# Patient Record
Sex: Male | Born: 1990 | Race: White | Hispanic: No | Marital: Single | Smoking: Current every day smoker
Health system: Southern US, Community
[De-identification: ages and names within clinical notes are randomized; demographics above are authoritative.]

## PROBLEM LIST (undated history)

## (undated) DIAGNOSIS — S6291XA Unspecified fracture of right wrist and hand, initial encounter for closed fracture: Secondary | ICD-10-CM

## (undated) DIAGNOSIS — S62509A Fracture of unspecified phalanx of unspecified thumb, initial encounter for closed fracture: Secondary | ICD-10-CM

## (undated) HISTORY — PX: WISDOM TOOTH EXTRACTION: SHX21

---
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
## 2011-07-20 ENCOUNTER — Emergency Department (HOSPITAL_COMMUNITY)
Admission: EM | Admit: 2011-07-20 | Discharge: 2011-07-20 | Disposition: A | Discharge status: Home / Self Care | Payer: Self-pay | Attending: Emergency Medicine | Admitting: Emergency Medicine

## 2011-07-20 ENCOUNTER — Encounter (HOSPITAL_COMMUNITY): Payer: Self-pay | Admitting: Emergency Medicine

## 2011-07-20 ENCOUNTER — Emergency Department (HOSPITAL_COMMUNITY): Payer: Self-pay

## 2011-07-20 DIAGNOSIS — S62523A Displaced fracture of distal phalanx of unspecified thumb, initial encounter for closed fracture: Secondary | ICD-10-CM

## 2011-07-20 DIAGNOSIS — S62639A Displaced fracture of distal phalanx of unspecified finger, initial encounter for closed fracture: Secondary | ICD-10-CM | POA: Insufficient documentation

## 2011-07-20 DIAGNOSIS — W230XXA Caught, crushed, jammed, or pinched between moving objects, initial encounter: Secondary | ICD-10-CM | POA: Insufficient documentation

## 2011-07-20 MED ORDER — HYDROCODONE-ACETAMINOPHEN 5-325 MG PO TABS: ORAL_TABLET | ORAL | Status: AC

## 2011-07-20 MED ORDER — IBUPROFEN 800 MG PO TABS
800.0000 mg | ORAL_TABLET | Freq: Once | ORAL | Status: AC
  Administered 2011-07-20: 800 mg via ORAL
  Filled 2011-07-20: qty 1

## 2011-07-20 MED ORDER — HYDROCODONE-ACETAMINOPHEN 5-325 MG PO TABS
1.0000 | ORAL_TABLET | Freq: Once | ORAL | Status: AC
  Administered 2011-07-20: 1 via ORAL
  Filled 2011-07-20: qty 1

## 2011-07-20 MED ORDER — IBUPROFEN 800 MG PO TABS: 800.0000 mg | ORAL_TABLET | Freq: Three times a day (TID) | ORAL | Status: AC | PRN

## 2011-07-20 NOTE — ED Notes (Signed)
Ladder fell on R thumb, pt splinted at home ;pt has + sensation, reports unable to move; pt reports that thumb bent backwards and he had to bend it back;

## 2011-07-20 NOTE — Discharge Instructions (Signed)
Please read and follow all provided instructions.  Your diagnoses today include:  1. Fracture of distal phalanx of thumb     Tests performed today include:  An x-ray of your thumb - shows broken bone  Vital signs. See below for your results today.   Medications prescribed:   Vicodin (hydrocodone/acetaminophen) - narcotic pain medication  If you have been prescribed narcotic pain medication such as Vicodin, Percocet or Tramadol: DO NOT drive or perform any activities that require you to be awake and alert because this medicine can make you drowsy. BE VERY CAREFUL not to take multiple medicines containing Tylenol (also called acetaminophen). Doing so can lead to an overdose which can damage your liver and cause liver failure and possibly death.    Ibuprofen - anti-inflammatory pain medication  Do not exceed 800mg  ibuprofen every 8 hours, take with food  You have been prescribed an anti-inflammatory medication or NSAID. Take with food. Take smallest effective dose for the shortest duration needed for your pain. Stop taking if you experience stomach pain or vomiting.   Take any prescribed medications only as directed.  Home care instructions:   Follow any educational materials contained in this packet  Wear your splint for at least one week or until seen by a physician for a follow-up examination.  Follow R.I.C.E. Protocol:  R - rest your injury   I  - use ice on injury without applying directly to skin  C - compress injury with bandage or splint  E - elevate the injury above the level of your heart as much as possible to reduce pain and swelling  Follow-up instructions: Please follow-up with your primary care provider or the provided orthopedic (bone specialist) if you continue to have significant pain or trouble using your thumb in 1 week. In this case you may have a severe injury that requires further care.   If you do not have a primary care doctor -- see below for  referral information.   Return instructions:   Please return if your fingers are numb or tingling, appear very red, white, gray or blue, or you have severe pain (also elevate wrist and loosen splint or wrap)  Please return if you have difficulty moving your fingers.  Please return to the Emergency Department if you experience worsening symptoms.   Please return if you have any other emergent concerns.  Additional Information:  Your vital signs today were: BP 132/87  Pulse 89  Temp(Src) 97.7 F (36.5 C) (Oral)  Resp 18  SpO2 97% If your blood pressure (BP) was elevated above 135/85 this visit, please have this repeated by your doctor within one month. -------------- Wrist injuries are frequent in adults and children. A sprain is an injury to the ligaments that hold your bones together. A strain is an injury to muscle or muscle tendons (cord like structure) from stretching or pulling.   Remember the importance of follow-up and possible follow-up x-rays. Improvement in pain level is not 100% insurance of not having a fracture. -------------- No Primary Care Doctor Call [facility]  [phone] Other agencies that provide inexpensive medical care    [facility]  [phone]    [facility]  [phone]    [facility]  [phone]    [facility]  [phone]    [facility]  [phone]    [facility]  [phone] -------------- RESOURCE GUIDE:  Dental Problems  Patients with Medicaid: [facility]  [facility] [address]                                            [phone] [address] [employer] [address] Phone:  [phone]                                                   Phone:  [phone]  If unable to pay or uninsured, contact:  [facility] or [facility] to become qualified for the adult dental clinic.  Chronic Pain Problems Contact [facility]  [phone] Patients  need to be referred by their primary care doctor.  Insufficient Money for Medicine Contact [employer]:  call "211" or [facility] [phone].  Psychological Services [facility]  [phone] [facility]  [phone] [facility]   [phone] (emergency services [phone])  Substance Abuse Resources [facility]  [phone] [facility] [phone] [facility] [phone] Floyd[facility] Flock [phone] Residential & Outpatient Substance Abuse Program  [phone]  Abuse/Neglect [facility] Child Abuse Hotline 805-735-3824 [facility] Child Abuse Hotline [phone] (After Hours)  Emergency Shelter [facility] [phone]  M[facility] [phone] [facility] [phone]  [facility] Resources  [facility]     [employer]                          [facility] Dept. [address] [place]                       [address]      [address]  Blondell Reveal Phone:  [phone]                                   Phone:  [phone]                 Phone:  [phone]  [facility] Phone:  [phone]  [facility] Child Abuse Hotline 878-662-1903 [phone] (After Hours)

## 2011-07-20 NOTE — Progress Notes (Signed)
Orthopedic Tech Progress Note Patient Details:  Larry Downs December 02, 1990 [id number]  Type of Splint: Thumb spica Splint Location: righty thumb Splint Interventions: Application    Nikki Dom 07/20/2011, 10:56 PM

## 2011-07-20 NOTE — ED Provider Notes (Signed)
History     CSN: [id number]  Arrival date & time 07/20/11  4098   First MD Initiated Contact with Patient 07/20/11 2003      Chief Complaint  Patient presents with  . Finger Injury    (Consider location/radiation/quality/duration/timing/severity/associated sxs/prior treatment) HPI Comments: Patient was working on a ladder and pinched thumb in ladder while retracting it. Occurred 3 hrs ago. Homemade splint applied without relief. Pain at IP joint with limited ROM. No wrist or elbow pain. No N/V, head injury. Movement or palp makes pain worse. Nothing makes it better. Pain does not radiate.   Patient is a 21 y.o. male presenting with hand pain. The history is provided by the patient.  Hand Pain This is a new problem. The current episode started today. The problem has been unchanged. Associated symptoms include arthralgias and joint swelling. Pertinent negatives include no fever, neck pain, numbness or weakness. Exacerbated by: palpation. He has tried immobilization for the symptoms. The treatment provided no relief.    History reviewed. No pertinent past medical history.  Past Surgical History  Procedure Date  . Wisdom tooth extraction     History reviewed. No pertinent family history.  History  Substance Use Topics  . Smoking status: Current Everyday Smoker -- 1.0 packs/day  . Smokeless tobacco: Not on file  . Alcohol Use: Yes     occasion      Review of Systems  Constitutional: Negative for fever.  HENT: Negative for neck pain.   Musculoskeletal: Positive for joint swelling and arthralgias. Negative for back pain and gait problem.  Skin: Negative for wound.  Neurological: Negative for weakness and numbness.    Allergies  Review of patient's allergies indicates no known allergies.  Home Medications  No current outpatient prescriptions on file.  BP 150/89  Pulse 94  Temp(Src) 98.6 F (37 C) (Oral)  Resp 16  SpO2 97%  Physical Exam  Nursing note and vitals  reviewed. Constitutional: He appears well-developed and well-nourished.  HENT:  Head: Normocephalic and atraumatic.  Eyes: Conjunctivae are normal.  Neck: Normal range of motion. Neck supple.  Cardiovascular: Normal pulses.   Pulses:      Radial pulses are 2+ on the right side, and 2+ on the left side.  Musculoskeletal: He exhibits tenderness. He exhibits no edema.       Right wrist: Normal. He exhibits normal range of motion.       Right hand: He exhibits normal capillary refill. normal sensation noted. Decreased strength noted. He exhibits thumb/finger opposition. He exhibits no finger abduction and no wrist extension trouble.       Hands: Neurological: He is alert. No sensory deficit.       Motor, sensation, and vascular distal to the injury is fully intact.   Skin: Skin is warm and dry.  Psychiatric: He has a normal mood and affect.    ED Course  Procedures (including critical care time)  Labs Reviewed - No data to display No results found.   1. Fracture of distal phalanx of thumb     8:42 PM Patient seen and examined. X-ray pending. Medications ordered.   Vital signs reviewed and are as follows: Filed Vitals:   07/20/11 1832  BP: 150/89  Pulse: 94  Temp: 98.6 F (37 C)  Resp: 16   X-ray reviewed by myself, discussed with Dr. Jeraldine Loots.   Patient informed. Ortho f/u given. Finger splint by ortho tech. RICE protocol counseling given.   Patient counseled on use of  narcotic pain medications. Counseled not to combine these medications with others containing tylenol. Urged not to drink alcohol, drive, or perform any other activities that requires focus while taking these medications. The patient verbalizes understanding and agrees with the plan.  MDM  Thumb injury, + fracture, not intraarticular. Immobilization performed. Hand f/u given. RICE protocol indicated. No vascular or neurological compromise.         Renne Crigler, [place] 07/21/11 1714

## 2011-07-21 NOTE — ED Provider Notes (Signed)
Medical screening examination/treatment/procedure(s) were performed by non-physician practitioner and as supervising physician I was immediately available for consultation/collaboration.  Bertha Earwood, MD 07/21/11 1756 

## 2012-06-20 ENCOUNTER — Emergency Department (HOSPITAL_COMMUNITY)
Admission: EM | Admit: 2012-06-20 | Discharge: 2012-06-20 | Disposition: A | Discharge status: Home / Self Care | Payer: Self-pay | Attending: Emergency Medicine | Admitting: Emergency Medicine

## 2012-06-20 ENCOUNTER — Emergency Department (HOSPITAL_COMMUNITY): Payer: Self-pay

## 2012-06-20 ENCOUNTER — Encounter (HOSPITAL_COMMUNITY): Payer: Self-pay | Admitting: Emergency Medicine

## 2012-06-20 DIAGNOSIS — S93402A Sprain of unspecified ligament of left ankle, initial encounter: Secondary | ICD-10-CM

## 2012-06-20 DIAGNOSIS — F17200 Nicotine dependence, unspecified, uncomplicated: Secondary | ICD-10-CM | POA: Insufficient documentation

## 2012-06-20 DIAGNOSIS — Y939 Activity, unspecified: Secondary | ICD-10-CM | POA: Insufficient documentation

## 2012-06-20 DIAGNOSIS — S60229A Contusion of unspecified hand, initial encounter: Secondary | ICD-10-CM | POA: Insufficient documentation

## 2012-06-20 DIAGNOSIS — R609 Edema, unspecified: Secondary | ICD-10-CM | POA: Insufficient documentation

## 2012-06-20 DIAGNOSIS — Y9289 Other specified places as the place of occurrence of the external cause: Secondary | ICD-10-CM | POA: Insufficient documentation

## 2012-06-20 DIAGNOSIS — Z8781 Personal history of (healed) traumatic fracture: Secondary | ICD-10-CM | POA: Insufficient documentation

## 2012-06-20 DIAGNOSIS — W108XXA Fall (on) (from) other stairs and steps, initial encounter: Secondary | ICD-10-CM | POA: Insufficient documentation

## 2012-06-20 DIAGNOSIS — S60222A Contusion of left hand, initial encounter: Secondary | ICD-10-CM

## 2012-06-20 DIAGNOSIS — S93409A Sprain of unspecified ligament of unspecified ankle, initial encounter: Secondary | ICD-10-CM | POA: Insufficient documentation

## 2012-06-20 HISTORY — DX: Unspecified fracture of right wrist and hand, initial encounter for closed fracture: S62.91XA

## 2012-06-20 HISTORY — DX: Fracture of unspecified phalanx of unspecified thumb, initial encounter for closed fracture: S62.509A

## 2012-06-20 MED ORDER — IBUPROFEN 600 MG PO TABS: 600.0000 mg | ORAL_TABLET | Freq: Four times a day (QID) | ORAL | Status: DC | PRN

## 2012-06-20 MED ORDER — HYDROCODONE-ACETAMINOPHEN 5-325 MG PO TABS
1.0000 | ORAL_TABLET | Freq: Once | ORAL | Status: AC
  Administered 2012-06-20: 1 via ORAL
  Filled 2012-06-20: qty 1

## 2012-06-20 MED ORDER — HYDROCODONE-ACETAMINOPHEN 5-325 MG PO TABS: 1.0000 | ORAL_TABLET | ORAL | Status: DC | PRN

## 2012-06-20 NOTE — ED Notes (Signed)
PT ambulated with baseline gait; VSS; A&Ox3; no signs of distress; respirations even and unlabored; skin warm and dry; no questions upon discharge.  

## 2012-06-20 NOTE — Progress Notes (Signed)
Orthopedic Tech Progress Note Patient Details:  Larry Downs 1991/02/24 [id number]  Ortho Devices Type of Ortho Device: Crutches Ortho Device/Splint Interventions: Application   Asia R Thompson 06/20/2012, 10:20 AM

## 2012-06-20 NOTE — ED Notes (Signed)
Ortho tech paged and coming shortly.

## 2012-06-20 NOTE — ED Provider Notes (Signed)
History     CSN: [id number]  Arrival date & time 06/20/12  9562   First MD Initiated Contact with Patient 06/20/12 [phone]      Chief Complaint  Patient presents with  . Ankle Pain    (Consider location/radiation/quality/duration/timing/severity/associated sxs/prior treatment) HPI Pt states he fell down multiple stairs last night at 2 am. Denies head or neck injury. No LOC. C/o left ankle and left hand pain. No numbness or weakness. +swelling and bruising. Unable to ambulate on L ankle.  Past Medical History  Diagnosis Date  . Broken thumb   . Fracture of right hand     Past Surgical History  Procedure Laterality Date  . Wisdom tooth extraction      No family history on file.  History  Substance Use Topics  . Smoking status: Current Every Day Smoker -- 1.00 packs/day  . Smokeless tobacco: Not on file  . Alcohol Use: Yes     Comment: occasion      Review of Systems  Constitutional: Negative for fever and chills.  HENT: Negative for facial swelling and neck pain.   Respiratory: Negative for shortness of breath.   Cardiovascular: Negative for chest pain.  Gastrointestinal: Negative for nausea, vomiting and abdominal pain.  Musculoskeletal: Positive for joint swelling. Negative for back pain.  Skin: Negative for wound.  Neurological: Negative for dizziness, weakness, light-headedness, numbness and headaches.  All other systems reviewed and are negative.    Allergies  Review of patient's allergies indicates no known allergies.  Home Medications  No current outpatient prescriptions on file.  BP 150/113  Pulse 119  Temp(Src) 98.3 F (36.8 C) (Oral)  Resp 20  SpO2 99%  Physical Exam  Nursing note and vitals reviewed. Constitutional: He is oriented to person, place, and time. He appears well-developed and well-nourished. No distress.  HENT:  Head: Normocephalic and atraumatic.  Mouth/Throat: Oropharynx is clear and moist.  Eyes: EOM are normal. Pupils are  equal, round, and reactive to light.  Neck: Normal range of motion. Neck supple.  No posterior midline cervical tenderness  Cardiovascular: Normal rate and regular rhythm.   Pulmonary/Chest: Effort normal and breath sounds normal. No respiratory distress. He has no wheezes. He has no rales.  Abdominal: Soft. Bowel sounds are normal. He exhibits no distension and no mass. There is no tenderness. There is no rebound and no guarding.  Musculoskeletal: Normal range of motion. He exhibits edema and tenderness.  Decreased ROM L ankle due to pain. TTP L lat and med mal. 2+DP/PT. TTP L thenar eminence. FROM. +contusion at site. Good distal cap refill.   Neurological: He is alert and oriented to person, place, and time.  5/5 motor in all ext, sensation intact  Skin: Skin is warm and dry. No rash noted. No erythema.  Psychiatric: He has a normal mood and affect. His behavior is normal.    ED Course  Procedures (including critical care time)  Labs Reviewed - No data to display Dg Ankle Complete Left  06/20/2012  *RADIOLOGY REPORT*  Clinical Data: Ankle pain post fall  LEFT ANKLE COMPLETE - 3+ VIEW  Comparison: 09/13/2006  Findings: Three views of the left ankle submitted.  No acute fracture or subluxation.  Ankle mortise is preserved.  There is soft tissue swelling adjacent to lateral malleolus.  IMPRESSION: No acute fracture or subluxation.  Soft tissue swelling adjacent to lateral malleolus.   Original Report Authenticated By: Natasha Mead, M.D.    Dg Hand Complete Left  06/20/2012  *  RADIOLOGY REPORT*  Clinical Data: Fall, bruising  LEFT HAND - COMPLETE 3+ VIEW  Comparison: None.  Findings: Three views of the left hand submitted.  No acute fracture or subluxation.  No radiopaque foreign body.  IMPRESSION: No acute fracture or subluxation.   Original Report Authenticated By: Natasha Mead, M.D.      1. Hand contusion, left, initial encounter   2. Ankle sprain, left, initial encounter       MDM           Loren Racer, MD 06/20/12 [phone]

## 2012-06-20 NOTE — ED Notes (Signed)
Patient transported to X-ray 

## 2012-06-20 NOTE — ED Notes (Signed)
Pt feel down carpeted apartment stairs; pain in R shoulder; L hand; L ankle swelling. Pt not able to put pressure on foot and limited range of motion. Denies LOC.

## 2013-10-13 IMAGING — CR DG ANKLE COMPLETE 3+V*L*
3 series · 3 of 3 positions shown · non-contrast
Comparison: 09/13/2006

CLINICAL DATA: Ankle pain post fall

LEFT ANKLE COMPLETE - 3+ VIEW

[x ankle ap left]
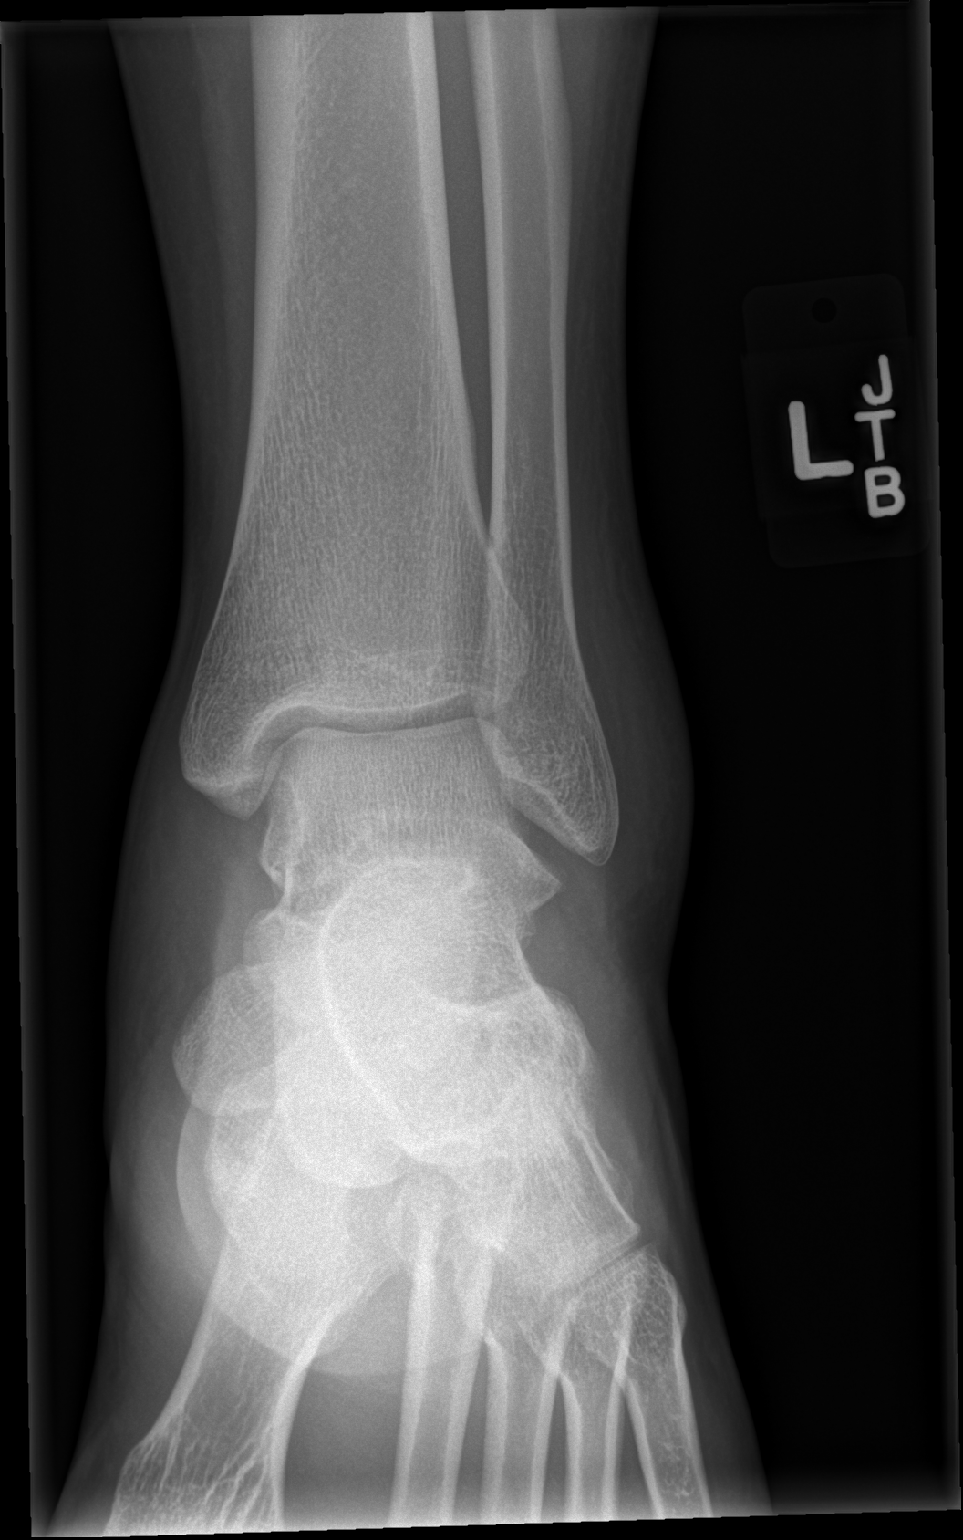

[x ankle obl left]
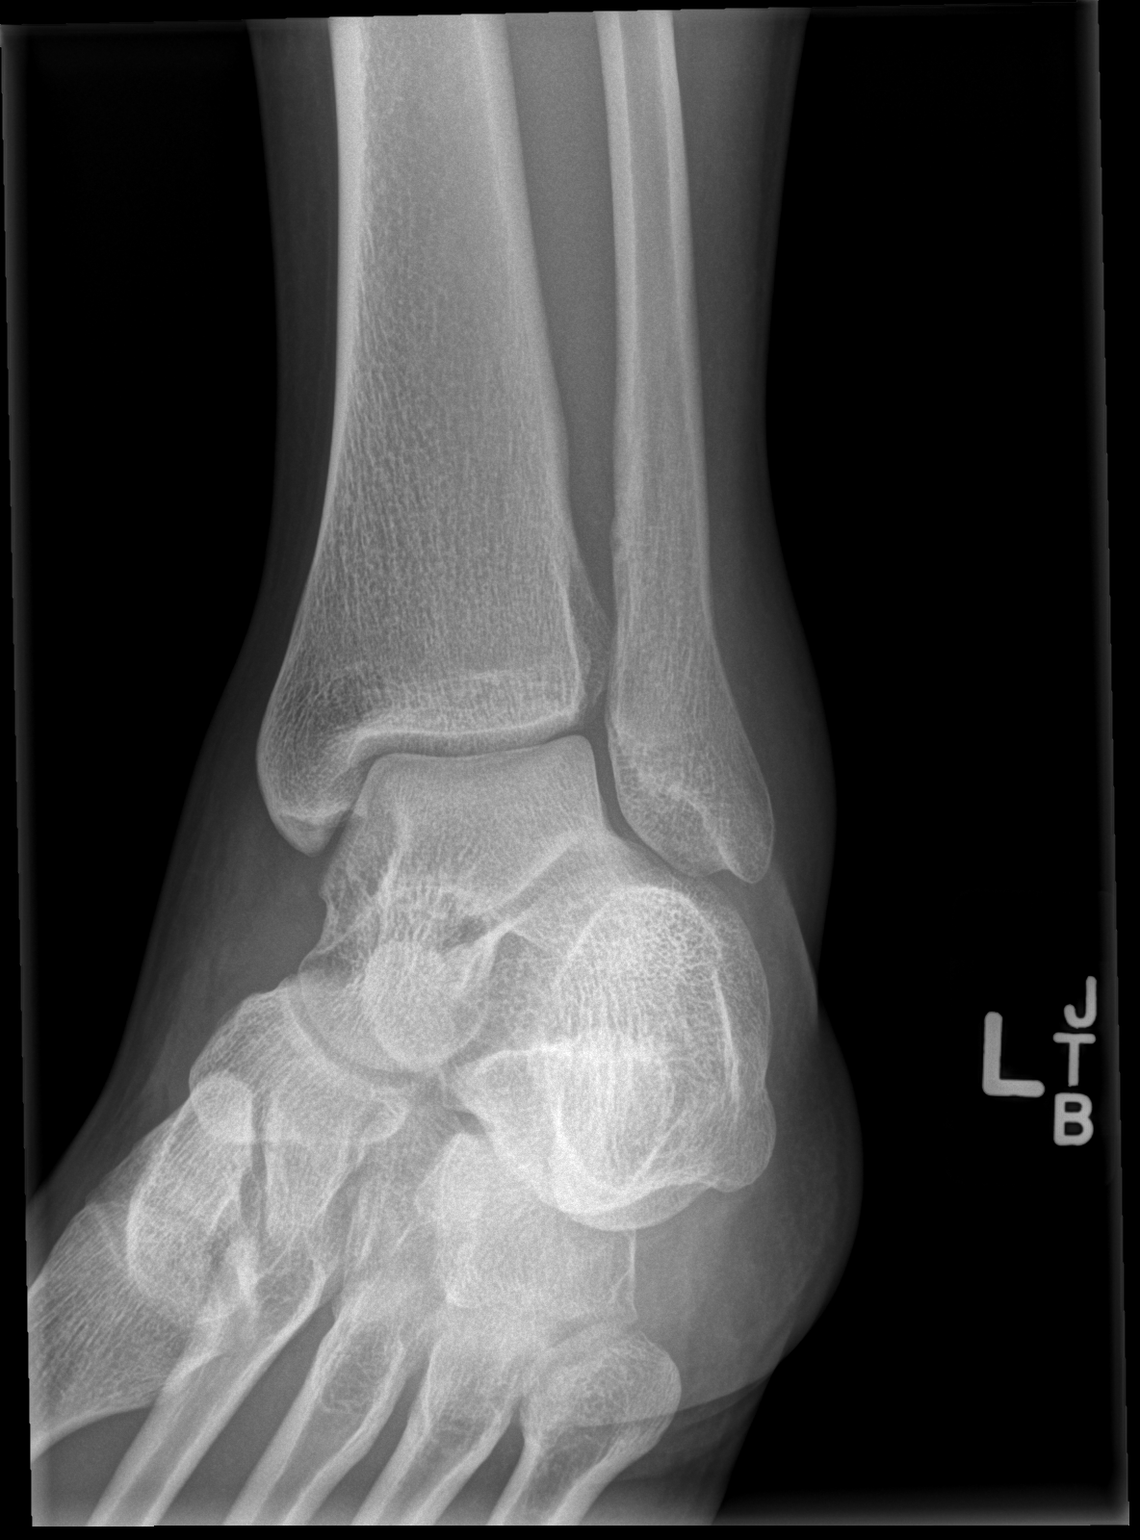

[x ankle lat left]
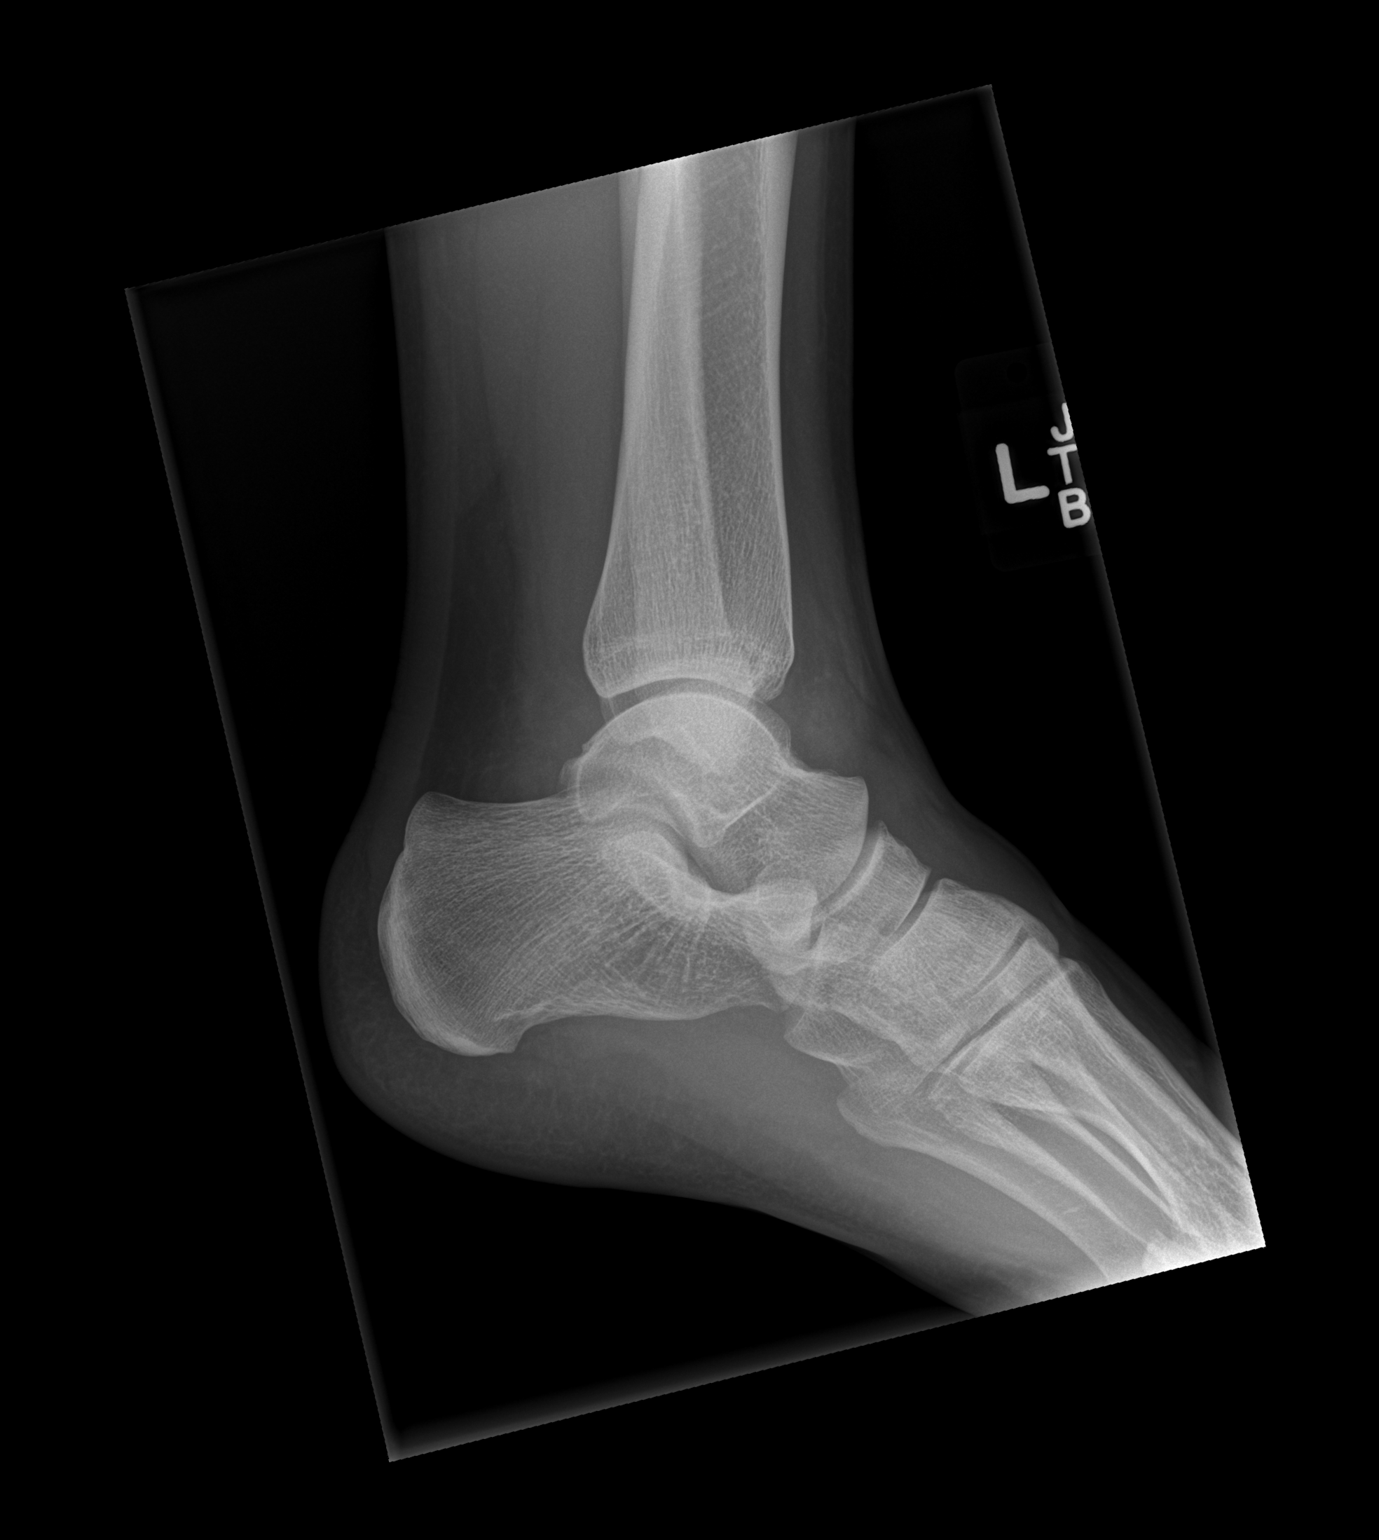

[3 of 3 positions shown; findings below may reference images not displayed]

FINDINGS: Three views of the left ankle submitted.  No acute
fracture or subluxation.  Ankle mortise is preserved.  There is
soft tissue swelling adjacent to lateral malleolus.
IMPRESSION: No acute fracture or subluxation.  Soft tissue swelling adjacent to
lateral malleolus.

## 2017-05-24 ENCOUNTER — Encounter (HOSPITAL_COMMUNITY): Payer: Self-pay | Admitting: Emergency Medicine

## 2017-05-24 ENCOUNTER — Emergency Department (HOSPITAL_COMMUNITY): Payer: Self-pay

## 2017-05-24 ENCOUNTER — Other Ambulatory Visit: Payer: Self-pay

## 2017-05-24 ENCOUNTER — Emergency Department (HOSPITAL_COMMUNITY)
Admission: EM | Admit: 2017-05-24 | Discharge: 2017-05-25 | Disposition: A | Discharge status: Home / Self Care | Payer: Self-pay | Attending: Emergency Medicine | Admitting: Emergency Medicine

## 2017-05-24 ENCOUNTER — Ambulatory Visit (HOSPITAL_COMMUNITY)
Admission: EM | Admit: 2017-05-24 | Discharge: 2017-05-24 | Disposition: A | Discharge status: Nursing Home (Includes ICF) | Payer: Self-pay | Attending: Emergency Medicine | Admitting: Emergency Medicine

## 2017-05-24 DIAGNOSIS — R079 Chest pain, unspecified: Secondary | ICD-10-CM

## 2017-05-24 DIAGNOSIS — E876 Hypokalemia: Secondary | ICD-10-CM | POA: Insufficient documentation

## 2017-05-24 DIAGNOSIS — I10 Essential (primary) hypertension: Secondary | ICD-10-CM | POA: Insufficient documentation

## 2017-05-24 DIAGNOSIS — F17290 Nicotine dependence, other tobacco product, uncomplicated: Secondary | ICD-10-CM | POA: Insufficient documentation

## 2017-05-24 LAB — CBC
HCT: 45.1 % (ref 39.0–52.0)
HEMOGLOBIN: 16 g/dL (ref 13.0–17.0)
MCH: 30.1 pg (ref 26.0–34.0)
MCHC: 35.5 g/dL (ref 30.0–36.0)
MCV: 84.9 fL (ref 78.0–100.0)
Platelets: 181 10*3/uL (ref 150–400)
RBC: 5.31 MIL/uL (ref 4.22–5.81)
RDW: 12.1 % (ref 11.5–15.5)
WBC: 8.3 10*3/uL (ref 4.0–10.5)

## 2017-05-24 LAB — BASIC METABOLIC PANEL WITH GFR
Anion gap: 11 (ref 5–15)
BUN: 13 mg/dL (ref 6–20)
CO2: 25 mmol/L (ref 22–32)
Calcium: 9.6 mg/dL (ref 8.9–10.3)
Chloride: 104 mmol/L (ref 101–111)
Creatinine, Ser: 1.13 mg/dL (ref 0.61–1.24)
GFR calc Af Amer: >60 mL/min
GFR calc non Af Amer: >60 mL/min
Glucose, Bld: 108 mg/dL — ABNORMAL HIGH (ref 65–99)
Potassium: 3.3 mmol/L — ABNORMAL LOW (ref 3.5–5.1)
Sodium: 140 mmol/L (ref 135–145)

## 2017-05-24 LAB — I-STAT TROPONIN, ED: Troponin i, poc: 0 ng/mL (ref 0.00–0.08)

## 2017-05-24 MED ORDER — ASPIRIN 81 MG PO CHEW
324.0000 mg | CHEWABLE_TABLET | Freq: Once | ORAL | Status: AC
  Administered 2017-05-24: 324 mg via ORAL

## 2017-05-24 MED ORDER — ASPIRIN 81 MG PO CHEW
CHEWABLE_TABLET | ORAL | Status: AC
  Filled 2017-05-24: qty 4

## 2017-05-24 NOTE — Discharge Instructions (Addendum)
Let them know if your chest pain changes, gets worse, if you start to feel nauseous or sweaty with this pain.

## 2017-05-24 NOTE — ED Provider Notes (Signed)
HPI  SUBJECTIVE:  Larry Downs is a 27 y.o. male who presents with intermittent, daily, left-sided chest pain.  He states that 2-1/2 weeks ago he had an episode of severe left-sided chest pain described as gripping, cramping that lasted approximately 5-10 minutes while he was walking.  Patient states he fell to his knees and was unable to move until it resolved.  He denies nausea, diaphoresis during this episode.  It resolved on its own.  Since then he has had daily, similar chest pain that is getting worse.  States that it has lasted all day today.  He describes it as pressure, heaviness.  He states that it radiates down his arm and through to his back.  He reports a cough.  He also reports water brash and belching.  He reports some occasional diaphoresis with this chest pain but it is not consistent.  He tried Pepto-Bismol, aspirin, rest with improvement in his symptoms.  Belching also seems to help.  No aggravating factors.  He denies nausea, radiation up into neck.  No exertional component to the chest pain today.  No wheezing.  No shortness of breath.  No calf pain, swelling, hemoptysis, immobilization, surgery in the past 4 weeks, exogenous estrogen.  His chest pain is not reproducible, not associated with torso rotation.  He denies change in his physical activity recently.  He denies burning chest pain, sore throat.  He has never had symptoms like this before.  He is a former cigarette smoker and currently vapes.  He has a history of hypertension since age 89, he is not taking medicines for this.  No history of diabetes, hypercholesterolemia, MI, HIV, coronary artery disease, PE, DVT, cancer, GERD.  Family history significant for grandfather with an MI in his 7050s and a nephew at MI at age 28.  He took 81 mg of aspirin today.  PMD: None.    Past Medical History:  Diagnosis Date  . Broken thumb   . Fracture of right hand     Past Surgical History:  Procedure Laterality Date  . WISDOM TOOTH  EXTRACTION      No family history on file.  Social History   Tobacco Use  . Smoking status: Current Every Day Smoker    Packs/day: 1.00    Types: E-cigarettes  Substance Use Topics  . Alcohol use: Yes    Comment: occasion  . Drug use: No    No current facility-administered medications for this encounter.  No current outpatient medications on file.  No Known Allergies   ROS  As noted in HPI.   Physical Exam  BP (!) 159/89 (BP Location: Right Arm)   Pulse 73   Temp 98.1 F (36.7 C) (Oral)   Resp 16   SpO2 97%   Constitutional: Well developed, well nourished, no acute distress Eyes:  EOMI, conjunctiva normal bilaterally HENT: Normocephalic, atraumatic,mucus membranes moist Respiratory: Normal inspiratory effort, lungs clear bilaterally.  Normal appearance.  No reproducible chest wall tenderness. Cardiovascular: Normal rate, regular rhythm, no murmurs, rubs, gallops GI: nondistended.  No palpable abdominal mass  skin: No rash, skin intact Musculoskeletal: no deformities calves symmetric, nontender, no edema. Neurologic: Alert & oriented x 3, no focal neuro deficits Psychiatric: Speech and behavior appropriate   ED Course   Medications  aspirin chewable tablet 324 mg (324 mg Oral Given 05/24/17 1934)    Orders Placed This Encounter  Procedures  . ED EKG    Standing Status:   Standing  Number of Occurrences:   1    Order Specific Question:   Reason for Exam    Answer:   Chest Pain    No results found for this or any previous visit (from the past 24 hour(s)). No results found.  ED Clinical Impression  Chest pain, unspecified type   ED Assessment/Plan  Giving 324 mg of aspirin here.  EKG: Normal sinus rhythm, rate 89.  Normal axis, normal intervals.  No hypertrophy.  Q waves inferiorly.  No ST elevation.  No previous EKG for comparison.  Patient's story is concerning enough and he states that his pain is getting worse, and it is not reproducible,  transferring to the ED for further workup and evaluation.  Feel that the patient is stable to go via private vehicle.  His EKG has no ST elevation.  Discussed rationale for transfer with the patient.  He agrees with plan.  Meds ordered this encounter  Medications  . aspirin chewable tablet 324 mg    *This clinic note was created using Scientist, clinical (histocompatibility and immunogenetics). Therefore, there may be occasional mistakes despite careful proofreading.   ?   Domenick Gong, MD 05/24/17 [phone]

## 2017-05-24 NOTE — ED Triage Notes (Signed)
Pt presents with central and L sided CP that has been intermittent since 2.5 wks; pt reports location of pain is inconsistent; also states he is a very stressful person and thinks the worst when things happen; pt states he has been more "belchy lately" and his grandmother thinks its gas

## 2017-05-24 NOTE — ED Notes (Addendum)
Patient denies pain and is resting comfortably. [facility]  Placed on cardiac monitor and continuous pulse ox.

## 2017-05-24 NOTE — ED Triage Notes (Addendum)
Pt c/o L sided chest pain x2 weeks off and on, nontender to palpation. Pain is not reproducible. Pt states the pain is very inconsistent. Pt states he has had some anxiety with panic attacks.

## 2017-05-25 MED ORDER — POTASSIUM CHLORIDE CRYS ER 20 MEQ PO TBCR
40.0000 meq | EXTENDED_RELEASE_TABLET | Freq: Once | ORAL | Status: AC
  Administered 2017-05-25: 40 meq via ORAL

## 2017-05-25 MED ORDER — RANITIDINE HCL 150 MG PO TABS: 150.0000 mg | ORAL_TABLET | Freq: Two times a day (BID) | ORAL | 0 refills | Status: AC

## 2017-05-25 NOTE — ED Provider Notes (Signed)
[facility] EMERGENCY DEPARTMENT Provider Note   CSN: [id number] Arrival date & time: 05/24/17  1942     History   Chief Complaint Chief Complaint  Patient presents with  . Chest Pain  . Anxiety    HPI Larry Downs is a 27 y.o. male with a hx of HTN, anxiety (not on medication), panic attacks presents to the Emergency Department complaining of intermittent nonfocal chest pain onset several weeks ago.  Pt reports the pain occurs every day appearing and disappearing throughout the day.  Pt reports it is not reproducible including with exertion.  Pt reports Pepto Bismol does help his symptoms.  No aggravating symptoms.  Pt has also taken ASA with some relief. Pt reports a hx of cocaine abuse, but reports last use was 2 years ago. Pt reports he quit smoking cigarettes 1.5 years ago, but does vape a high nicotine "juice."  Pt denies fever, chills, headache, neck pain, SOB, abd pain, N/V/D, weakness, dizziness, syncope, rash, dysuria, palpitations.  No hx of DVT or family early cardiac death.     The history is provided by the patient and medical records. No language interpreter was used.    Past Medical History:  Diagnosis Date  . Broken thumb   . Fracture of right hand     There are no active problems to display for this patient.   Past Surgical History:  Procedure Laterality Date  . WISDOM TOOTH EXTRACTION          Home Medications    Prior to Admission medications   Medication Sig Start Date End Date Taking? Authorizing Provider  ranitidine (ZANTAC) 150 MG tablet Take 1 tablet (150 mg total) by mouth 2 (two) times daily. 05/25/17   Jareth Pardee, Dahlia Client, PA-C    Family History No family history on file.  Social History Social History   Tobacco Use  . Smoking status: Current Every Day Smoker    Packs/day: 1.00    Types: E-cigarettes  Substance Use Topics  . Alcohol use: Yes    Comment: occasion  . Drug use: No     Allergies   Patient  has no known allergies.   Review of Systems Review of Systems  Constitutional: Negative for appetite change, diaphoresis, fatigue, fever and unexpected weight change.  HENT: Negative for mouth sores.   Eyes: Negative for visual disturbance.  Respiratory: Negative for cough, chest tightness, shortness of breath and wheezing.   Cardiovascular: Positive for chest pain.  Gastrointestinal: Negative for abdominal pain, constipation, diarrhea, nausea and vomiting.  Endocrine: Negative for polydipsia, polyphagia and polyuria.  Genitourinary: Negative for dysuria, frequency, hematuria and urgency.  Musculoskeletal: Negative for back pain and neck stiffness.  Skin: Negative for rash.  Allergic/Immunologic: Negative for immunocompromised state.  Neurological: Negative for syncope, light-headedness and headaches.  Hematological: Does not bruise/bleed easily.  Psychiatric/Behavioral: Negative for sleep disturbance. The patient is not nervous/anxious.      Physical Exam Updated Vital Signs BP (!) 144/94   Pulse 66   Temp (!) 97.5 F (36.4 C) (Oral)   Resp (!) 21   Ht 6\' 1"  (1.854 m)   Wt 88.5 kg (195 lb)   SpO2 99%   BMI 25.73 kg/m   Physical Exam  Constitutional: He appears well-developed and well-nourished. No distress.  Awake, alert, nontoxic appearance  HENT:  Head: Normocephalic and atraumatic.  Mouth/Throat: Oropharynx is clear and moist. No oropharyngeal exudate.  Eyes: Conjunctivae are normal. No scleral icterus.  Neck: Normal range  of motion. Neck supple.  Cardiovascular: Normal rate, regular rhythm and intact distal pulses.  Pulmonary/Chest: Effort normal and breath sounds normal. No respiratory distress. He has no wheezes. He exhibits tenderness ( Mild).  Equal chest expansion TTP along the left lateral side of his chest.  No flail segment  Abdominal: Soft. Bowel sounds are normal. He exhibits no mass. There is no tenderness. There is no rebound and no guarding.    Musculoskeletal: Normal range of motion. He exhibits no edema.       Right lower leg: He exhibits no edema.       Left lower leg: He exhibits no edema.  No Calf tenderness  Neurological: He is alert.  Speech is clear and goal oriented Moves extremities without ataxia  Skin: Skin is warm and dry. He is not diaphoretic.  Psychiatric: He has a normal mood and affect.  Nursing note and vitals reviewed.    ED Treatments / Results  Labs (all labs ordered are listed, but only abnormal results are displayed) Labs Reviewed  BASIC METABOLIC PANEL - Abnormal; Notable for the following components:      Result Value   Potassium 3.3 (*)    Glucose, Bld 108 (*)    All other components within normal limits  CBC  I-STAT TROPONIN, ED    EKG Interpretation  Date/Time:  Friday May 24 2017 19:55:52 EDT Ventricular Rate:  83 PR Interval:  124 QRS Duration: 92 QT Interval:  356 QTC Calculation: 418 R Axis:   91 Text Interpretation:  Normal sinus rhythm Rightward axis Borderline ECG No significant change was found Confirmed by Azalia Bilis ([id number]) on 05/25/2017 12:34:06 AM        Radiology Dg Chest 2 View  Result Date: 05/24/2017 CLINICAL DATA:  Chest pain EXAM: CHEST - 2 VIEW COMPARISON:  None. FINDINGS: The heart size and mediastinal contours are within normal limits. Both lungs are clear. The visualized skeletal structures are unremarkable. IMPRESSION: No active cardiopulmonary disease. Electronically Signed   By: Jasmine Pang M.D.   On: 05/24/2017 20:40    Procedures Procedures (including critical care time)  Medications Ordered in ED Medications  potassium chloride SA (K-DUR,KLOR-CON) CR tablet 40 mEq (has no administration in time range)     Initial Impression / Assessment and Plan / ED Course  I have reviewed the triage vital signs and the nursing notes.  Pertinent labs & imaging results that were available during my care of the patient were reviewed by me and considered  in my medical decision making (see chart for details).  Clinical Course as of May 25 56  Sat May 25, 2017  0026 Mild, replacement started in the ED  Potassium(!): 3.3 [HM]  0026 Troponin i, poc: 0.00 [HM]  0026 HTN noted  BP(!): 144/94 [HM]    Clinical Course User Index [HM] Lucah Petta, Boyd Kerbs    Presents with intermittent rest pain over the last several weeks.  Mild hypokalemia noted here in the emergency department.  Troponin is negative.  EKG is nonischemic.  No evidence of acute coronary syndrome.  Patient has no risk factors for pulmonary embolism and is not tachycardic or hypoxic.  He was noted to be hypertensive.  He has a history of same but is not currently on any medications.  Chest x-ray without evidence of pneumonia or pulmonary edema.  Less likely to be acute coronary syndrome.  No recent viral illnesses and negative troponin.  Doubt myocarditis.  Patient will be given Zantac for  symptom control as there is some potential this is reflux.  Discussed management of his anxiety and need for close follow-up with primary care for his hypertension.  Patient will also be referred to cardiology for further evaluation of his chest pain.  It is not exertional at this time however he was instructed to return immediately to the emergency department for exertional chest pain or associated symptoms.  Patient states understanding and is in agreement with this plan.  The patient was discussed with and ECG reviewed by Dr. Patria Maneampos who agrees with the treatment plan.   Final Clinical Impressions(s) / ED Diagnoses   Final diagnoses:  Central chest pain  Hypertension, unspecified type  Hypokalemia    ED Discharge Orders        Ordered    ranitidine (ZANTAC) 150 MG tablet  2 times daily     05/25/17 0050       Basil Buffin, Dahlia ClientHannah, PA-C 05/25/17 0059    Azalia Bilisampos, Kevin, MD 05/25/17 0140

## 2017-05-25 NOTE — Discharge Instructions (Addendum)
1. Medications: Zantac, usual home medications 2. Treatment: rest, drink plenty of fluids,  3. Follow Up: Please followup with your primary doctor in 2-3 days for discussion of your diagnoses and further evaluation after today's visit; if you do not have a primary care doctor use the resource guide provided to find one; Please return to the ER for chest pain that is associated with sweating, nausea, or radiates

## 2018-09-16 IMAGING — CR DG CHEST 2V
2 series · 2 of 2 positions shown · non-contrast
Comparison: None.

CLINICAL DATA: Chest pain

EXAM:
CHEST - 2 VIEW

[chest pa]
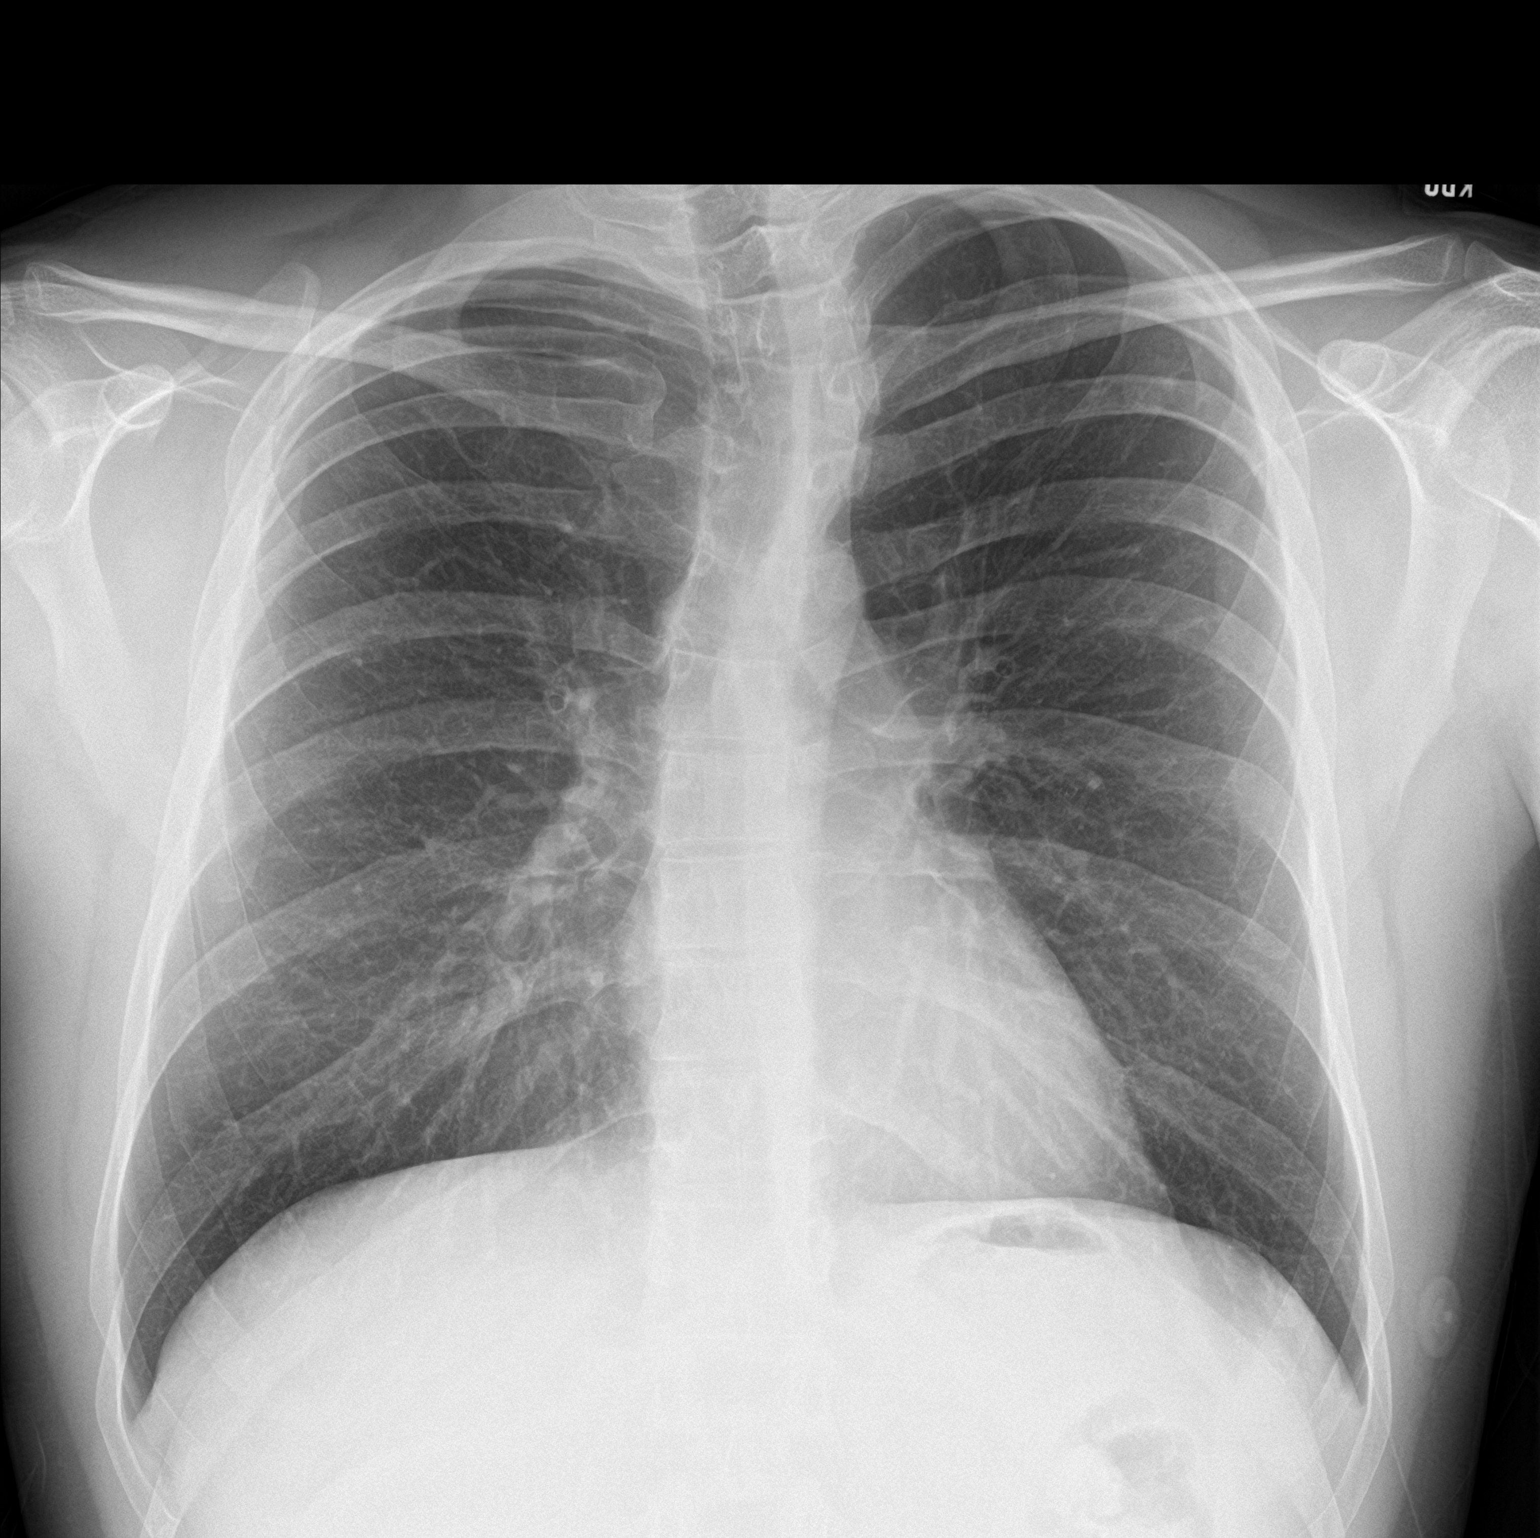

[chest lat]
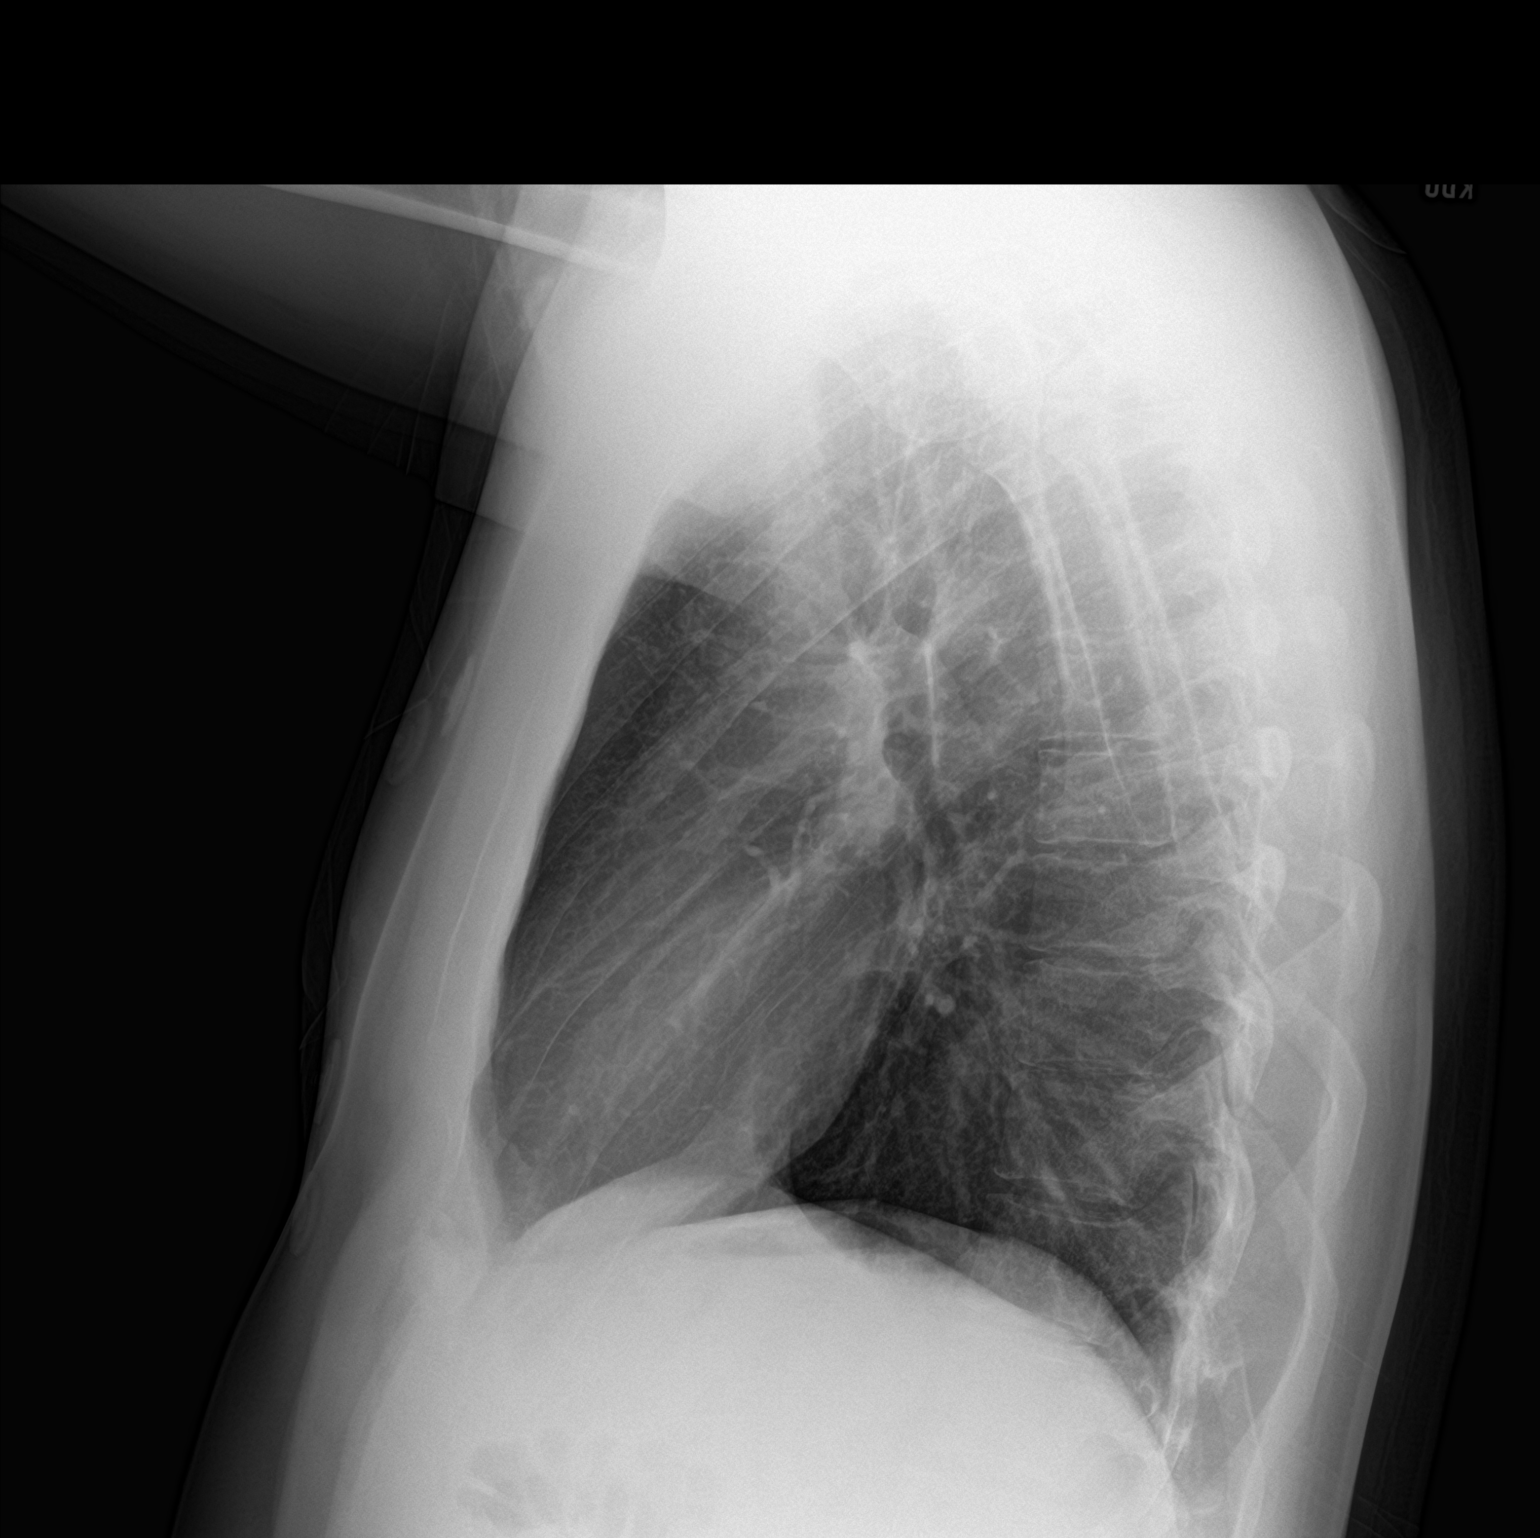

[2 of 2 positions shown; findings below may reference images not displayed]

FINDINGS: The heart size and mediastinal contours are within normal limits.
Both lungs are clear. The visualized skeletal structures are
unremarkable.
IMPRESSION: No active cardiopulmonary disease.

## 2019-12-22 ENCOUNTER — Emergency Department (HOSPITAL_COMMUNITY): Payer: Self-pay

## 2019-12-22 ENCOUNTER — Emergency Department (HOSPITAL_COMMUNITY)
Admission: EM | Admit: 2019-12-22 | Discharge: 2019-12-23 | Disposition: A | Discharge status: Home / Self Care | Payer: Self-pay | Attending: Emergency Medicine | Admitting: Emergency Medicine

## 2019-12-22 ENCOUNTER — Encounter (HOSPITAL_COMMUNITY): Payer: Self-pay | Admitting: Emergency Medicine

## 2019-12-22 ENCOUNTER — Other Ambulatory Visit: Payer: Self-pay

## 2019-12-22 DIAGNOSIS — R079 Chest pain, unspecified: Secondary | ICD-10-CM | POA: Insufficient documentation

## 2019-12-22 DIAGNOSIS — F17290 Nicotine dependence, other tobacco product, uncomplicated: Secondary | ICD-10-CM | POA: Insufficient documentation

## 2019-12-22 LAB — BASIC METABOLIC PANEL
Anion gap: 13 (ref 5–15)
BUN: 8 mg/dL (ref 6–20)
CO2: 25 mmol/L (ref 22–32)
Calcium: 9.9 mg/dL (ref 8.9–10.3)
Chloride: 103 mmol/L (ref 98–111)
Creatinine, Ser: 1.03 mg/dL (ref 0.61–1.24)
GFR, Estimated: >60 mL/min (ref >60)
Glucose, Bld: 77 mg/dL (ref 70–99)
Potassium: 3.7 mmol/L (ref 3.5–5.1)
Sodium: 141 mmol/L (ref 135–145)

## 2019-12-22 LAB — CBC
HCT: 53.1 % — ABNORMAL HIGH (ref 39.0–52.0)
Hemoglobin: 17.9 g/dL — ABNORMAL HIGH (ref 13.0–17.0)
MCH: 29.8 pg (ref 26.0–34.0)
MCHC: 33.7 g/dL (ref 30.0–36.0)
MCV: 88.5 fL (ref 80.0–100.0)
Platelets: 254 10*3/uL (ref 150–400)
RBC: 6 MIL/uL — ABNORMAL HIGH (ref 4.22–5.81)
RDW: 11.4 % — ABNORMAL LOW (ref 11.5–15.5)
WBC: 9.7 10*3/uL (ref 4.0–10.5)
nRBC: 0 % (ref 0.0–0.2)

## 2019-12-22 LAB — TROPONIN I (HIGH SENSITIVITY)
Troponin I (High Sensitivity): 3 ng/L (ref <18)
Troponin I (High Sensitivity): 3 ng/L (ref <18)

## 2019-12-22 NOTE — ED Triage Notes (Signed)
Patient arrives to ED with complaints of centralized chest pain and shortness of breath x6 days. Pt describes the pain is tight and is intermittent. Sates has feels anxiety and stress all week. Came in to ED today due to pain that radiated to left arm. No drug use, hx panic attacks.

## 2019-12-23 MED ORDER — HYDROXYZINE HCL 25 MG PO TABS: 25.0000 mg | ORAL_TABLET | Freq: Four times a day (QID) | ORAL | 0 refills | Status: AC

## 2019-12-23 MED ORDER — HYDROXYZINE HCL 25 MG PO TABS
25.0000 mg | ORAL_TABLET | Freq: Once | ORAL | Status: AC
  Administered 2019-12-23: 25 mg via ORAL

## 2019-12-23 NOTE — ED Provider Notes (Signed)
[facility] EMERGENCY DEPARTMENT Provider Note   CSN: [id number] Arrival date & time: 12/22/19  1448     History Chief Complaint  Patient presents with  . Chest Pain    Larry Downs is a 29 y.o. male.  The history is provided by the patient and medical records.  Chest Pain   29 year old male with history of hypertension no longer on medication, presenting to the ED with chest pain.  States he attended a festival about 7 days ago and had an episode of claustrophobia and panic.  States every day since then he has been in and at her state of panic and has been hyper fixated on his symptoms.  States he has been having chest tightness which he describes as almost like a "cramp" in the left side of his chest.  He has not had any shortness of breath but states he has had some "numbness" down both of his arms.  States he has been googling his symptoms online which causes more panic, hyperventilates, and feels like he is going to pass out.  States over the past year he has had a lot of stress in his life and does not think he is handling it quite as well as he should be.  He has never been diagnosed with anxiety and is never taken any medications, states he is nervous about addictive substances.  He has no known cardiac history.  Denies SI/HI/AVH.  Past Medical History:  Diagnosis Date  . Broken thumb   . Fracture of right hand     There are no problems to display for this patient.   Past Surgical History:  Procedure Laterality Date  . WISDOM TOOTH EXTRACTION         History reviewed. No pertinent family history.  Social History   Tobacco Use  . Smoking status: Current Every Day Smoker    Packs/day: 1.00    Types: E-cigarettes  Substance Use Topics  . Alcohol use: Yes    Comment: occasion  . Drug use: No    Home Medications Prior to Admission medications   Medication Sig Start Date End Date Taking? Authorizing Provider  ranitidine (ZANTAC) 150 MG  tablet Take 1 tablet (150 mg total) by mouth 2 (two) times daily. 05/25/17   Muthersbaugh, Dahlia Client, PA-C    Allergies    Patient has no known allergies.  Review of Systems   Review of Systems  Cardiovascular: Positive for chest pain.  All other systems reviewed and are negative.   Physical Exam Updated Vital Signs BP 130/86   Pulse 83   Temp 98.4 F (36.9 C)   Resp 16   Ht 6\' 1"  (1.854 m)   Wt 88.5 kg   SpO2 97%   BMI 25.73 kg/m   Physical Exam Vitals and nursing note reviewed.  Constitutional:      Appearance: He is well-developed.  HENT:     Head: Normocephalic and atraumatic.  Eyes:     Conjunctiva/sclera: Conjunctivae normal.     Pupils: Pupils are equal, round, and reactive to light.  Cardiovascular:     Rate and Rhythm: Normal rate and regular rhythm.     Heart sounds: Normal heart sounds.  Pulmonary:     Effort: Pulmonary effort is normal.     Breath sounds: Normal breath sounds. No wheezing or rhonchi.  Abdominal:     General: Bowel sounds are normal.     Palpations: Abdomen is soft.  Musculoskeletal:  General: Normal range of motion.     Cervical back: Normal range of motion.  Skin:    General: Skin is warm and dry.  Neurological:     Mental Status: He is alert and oriented to person, place, and time.  Psychiatric:        Mood and Affect: Mood is anxious.     ED Results / Procedures / Treatments   Labs (all labs ordered are listed, but only abnormal results are displayed) Labs Reviewed  CBC - Abnormal; Notable for the following components:      Result Value   RBC 6.00 (*)    Hemoglobin 17.9 (*)    HCT 53.1 (*)    RDW 11.4 (*)    All other components within normal limits  BASIC METABOLIC PANEL  TROPONIN I (HIGH SENSITIVITY)  TROPONIN I (HIGH SENSITIVITY)    EKG None  Radiology DG Chest 2 View  Result Date: 12/22/2019 CLINICAL DATA:  Chest pain. EXAM: CHEST - 2 VIEW COMPARISON:  May 24, 2017 FINDINGS: The heart size and  mediastinal contours are within normal limits. Both lungs are clear. No pleural effusions or pneumothorax. No acute osseous abnormality. Similar dextrocurvature of the upper thoracic spine. IMPRESSION: No acute cardiopulmonary disease. Electronically Signed   By: Feliberto Harts MD   On: 12/22/2019 17:12    Procedures Procedures (including critical care time)  Medications Ordered in ED Medications  hydrOXYzine (ATARAX/VISTARIL) tablet 25 mg (has no administration in time range)    ED Course  I have reviewed the triage vital signs and the nursing notes.  Pertinent labs & imaging results that were available during my care of the patient were reviewed by me and considered in my medical decision making (see chart for details).    MDM Rules/Calculators/A&P  29 year old male presenting to the ED with chest pain for the past week.  EKG here is nonischemic.  Labs are reassuring including troponin x2.  Chest x-ray is clear.  He has not had any fever or other infectious symptoms.  Doubt ACS, PE, dissection, or other acute cardiac event.  After talking with patient, it sounds like he has been having frequent panic attacks.  He admits all he has been thinking about for the past week his chest pain and what it means.  Symptoms do get worse when he tries to look up his symptoms online.  He does have anxiety about having panic attacks.  Denies SI/HI/AVH.  Discussed options with him regarding nonhabit-forming medication such as hydroxyzine, he is open to this.  We will also give outpatient resources for therapy and/or counseling.  He was also given information for [facility] if more emergent follow-up needed.  He may return here for any new/acute changes.  Final Clinical Impression(s) / ED Diagnoses Final diagnoses:  Chest pain in adult    Rx / DC Orders ED Discharge Orders         Ordered    hydrOXYzine (ATARAX/VISTARIL) 25 MG tablet  Every 6 hours        12/23/19 0102           Garlon Hatchet,  PA-C 12/23/19 7253    Geoffery Lyons, MD 12/23/19 0403

## 2019-12-23 NOTE — Discharge Instructions (Addendum)
I have sent prescription for hydroxyzine to your pharmacy.  Take as directed. I have also attached list of outpatient resources if you would like to start counseling or therapy. There is also a behavioral health urgent care that is 24/7 that is always available to you as well.  I have listed their address and phone number. Please return here for any new or acute changes.

## 2020-02-28 ENCOUNTER — Emergency Department
Admission: EM | Admit: 2020-02-28 | Discharge: 2020-02-28 | Disposition: A | Discharge status: Home / Self Care | Payer: Self-pay | Attending: Emergency Medicine | Admitting: Emergency Medicine

## 2020-02-28 ENCOUNTER — Encounter: Payer: Self-pay | Admitting: Emergency Medicine

## 2020-02-28 ENCOUNTER — Other Ambulatory Visit: Payer: Self-pay

## 2020-02-28 DIAGNOSIS — F17210 Nicotine dependence, cigarettes, uncomplicated: Secondary | ICD-10-CM | POA: Insufficient documentation

## 2020-02-28 DIAGNOSIS — F410 Panic disorder [episodic paroxysmal anxiety] without agoraphobia: Secondary | ICD-10-CM | POA: Insufficient documentation

## 2020-02-28 LAB — COMPREHENSIVE METABOLIC PANEL
ALT: 22 U/L (ref 0–44)
AST: 20 U/L (ref 15–41)
Albumin: 4.9 g/dL (ref 3.5–5.0)
Alkaline Phosphatase: 56 U/L (ref 38–126)
Anion gap: 11 (ref 5–15)
BUN: 13 mg/dL (ref 6–20)
CO2: 22 mmol/L (ref 22–32)
Calcium: 9.9 mg/dL (ref 8.9–10.3)
Chloride: 105 mmol/L (ref 98–111)
Creatinine, Ser: 1.08 mg/dL (ref 0.61–1.24)
GFR, Estimated: >60 mL/min (ref >60)
Glucose, Bld: 114 mg/dL — ABNORMAL HIGH (ref 70–99)
Potassium: 3.5 mmol/L (ref 3.5–5.1)
Sodium: 138 mmol/L (ref 135–145)
Total Bilirubin: 1.6 mg/dL — ABNORMAL HIGH (ref 0.3–1.2)
Total Protein: 8.6 g/dL — ABNORMAL HIGH (ref 6.5–8.1)

## 2020-02-28 LAB — URINE DRUG SCREEN, QUALITATIVE (ARMC ONLY)
Amphetamines, Ur Screen: NOT DETECTED
Barbiturates, Ur Screen: NOT DETECTED
Benzodiazepine, Ur Scrn: NOT DETECTED
Cannabinoid 50 Ng, Ur ~~LOC~~: POSITIVE — AB
Cocaine Metabolite,Ur ~~LOC~~: NOT DETECTED
MDMA (Ecstasy)Ur Screen: NOT DETECTED
Methadone Scn, Ur: NOT DETECTED
Opiate, Ur Screen: NOT DETECTED
Phencyclidine (PCP) Ur S: NOT DETECTED
Tricyclic, Ur Screen: NOT DETECTED

## 2020-02-28 LAB — SALICYLATE LEVEL: Salicylate Lvl: <7 mg/dL — ABNORMAL LOW (ref 7.0–30.0)

## 2020-02-28 LAB — CBC
HCT: 50.3 % (ref 39.0–52.0)
Hemoglobin: 17.8 g/dL — ABNORMAL HIGH (ref 13.0–17.0)
MCH: 30.1 pg (ref 26.0–34.0)
MCHC: 35.4 g/dL (ref 30.0–36.0)
MCV: 85.1 fL (ref 80.0–100.0)
Platelets: 213 10*3/uL (ref 150–400)
RBC: 5.91 MIL/uL — ABNORMAL HIGH (ref 4.22–5.81)
RDW: 11.5 % (ref 11.5–15.5)
WBC: 10.1 10*3/uL (ref 4.0–10.5)
nRBC: 0 % (ref 0.0–0.2)

## 2020-02-28 LAB — ETHANOL: Alcohol, Ethyl (B): <10 mg/dL (ref <10)

## 2020-02-28 LAB — ACETAMINOPHEN LEVEL: Acetaminophen (Tylenol), Serum: <10 ug/mL — ABNORMAL LOW (ref 10–30)

## 2020-02-28 MED ORDER — LORAZEPAM 2 MG PO TABS
2.0000 mg | ORAL_TABLET | Freq: Once | ORAL | Status: AC
  Administered 2020-02-28: 14:00:00 2 mg via ORAL
  Filled 2020-02-28: qty 1

## 2020-02-28 MED ORDER — ALPRAZOLAM 0.5 MG PO TABS: 1.0000 mg | ORAL_TABLET | Freq: Once | ORAL | Status: DC

## 2020-02-28 NOTE — ED Triage Notes (Signed)
Pt to ER with c/o anxiety that has been increasing over last few weeks.  Pt states has been struggling with anxiety for last several years, but it is getting worse.  Pt states was seen in past for same and was prescribed medications that he never took because he "felt fine".  Pt denies SI at this time.  Pt states this feels some different than previous attacks.

## 2020-02-28 NOTE — ED Notes (Signed)
States grandmother is going to drive him home.

## 2020-02-28 NOTE — ED Provider Notes (Signed)
[facility] Emergency Department Provider Note ____________________________________________   Event Date/Time   First MD Initiated Contact with Patient 02/28/20 1339     (approximate)  I have reviewed the triage vital signs and the nursing notes.   HISTORY  Chief Complaint Anxiety  HPI Larry Downs is a 29 y.o. male with history of anxiety that has increased over the last few weeks presents to the emergency department for treatment and evaluation. He states that for the past 3 days he has been in constant fear that something was going to happen to him. He states that he "feels fine" in his head, but his body is just in constant turmoil. He has tried several relaxation techniques without improvement. He has also taken a Clonopin that was prescribed at the last visit which didn't help either.       Past Medical History:  Diagnosis Date  . Broken thumb   . Fracture of right hand     There are no problems to display for this patient.   Past Surgical History:  Procedure Laterality Date  . WISDOM TOOTH EXTRACTION      Prior to Admission medications   Medication Sig Start Date End Date Taking? Authorizing Provider  hydrOXYzine (ATARAX/VISTARIL) 25 MG tablet Take 1 tablet (25 mg total) by mouth every 6 (six) hours. 12/23/19   Garlon Hatchet, PA-C  ranitidine (ZANTAC) 150 MG tablet Take 1 tablet (150 mg total) by mouth 2 (two) times daily. 05/25/17   Muthersbaugh, Dahlia Client, PA-C    Allergies Patient has no known allergies.  History reviewed. No pertinent family history.  Social History Social History   Tobacco Use  . Smoking status: Current Every Day Smoker    Packs/day: 1.00    Types: E-cigarettes  . Smokeless tobacco: Never Used  Vaping Use  . Vaping Use: Every day  Substance Use Topics  . Alcohol use: Yes    Comment: occasion  . Drug use: Yes    Types: Marijuana    Review of Systems  Constitutional: No fever/chills Eyes: No  visual changes. ENT: No sore throat. Cardiovascular: Denies chest pain. Respiratory: Denies shortness of breath. Gastrointestinal: No abdominal pain.  No nausea, no vomiting.  No diarrhea.  No constipation. Genitourinary: Negative for dysuria. Musculoskeletal: Negative for back pain. Skin: Negative for rash. Neurological: Negative for headaches, focal weakness or numbness. Psychiatric: Positive for anxiety and panic  ____________________________________________   PHYSICAL EXAM:  VITAL SIGNS: ED Triage Vitals  Enc Vitals Group     BP 02/28/20 1127 (!) 140/91     Pulse Rate 02/28/20 1127 66     Resp 02/28/20 1127 18     Temp 02/28/20 1127 98.5 F (36.9 C)     Temp Source 02/28/20 1127 Oral     SpO2 02/28/20 1127 97 %     Weight 02/28/20 1128 200 lb (90.7 kg)     Height 02/28/20 1128 6\' 1"  (1.854 m)     Head Circumference --      Peak Flow --      Pain Score 02/28/20 1128 0     Pain Loc --      Pain Edu? --      Excl. in GC? --     Constitutional: Alert and oriented. Well appearing and in no acute distress. Eyes: Conjunctivae are normal. PERRL. EOMI. Head: Atraumatic. Nose: No congestion/rhinnorhea. Mouth/Throat: Mucous membranes are moist.  Oropharynx non-erythematous. Neck: No stridor.   Hematological/Lymphatic/Immunilogical: No cervical lymphadenopathy. Cardiovascular:  Normal rate, regular rhythm. Grossly normal heart sounds.  Good peripheral circulation. Respiratory: Normal respiratory effort.  No retractions. Lungs CTAB. Gastrointestinal: Soft and nontender. No distention. No abdominal bruits. No CVA tenderness. Genitourinary:  Musculoskeletal: No lower extremity tenderness nor edema.  No joint effusions. Neurologic:  Normal speech and language. No gross focal neurologic deficits are appreciated. No gait instability. Skin:  Skin is warm, dry and intact. No rash noted. Psychiatric: Tearful, denies SI or HI, Speech  clear.  ____________________________________________   LABS (all labs ordered are listed, but only abnormal results are displayed)  Labs Reviewed  COMPREHENSIVE METABOLIC PANEL - Abnormal; Notable for the following components:      Result Value   Glucose, Bld 114 (*)    Total Protein 8.6 (*)    Total Bilirubin 1.6 (*)    All other components within normal limits  SALICYLATE LEVEL - Abnormal; Notable for the following components:   Salicylate Lvl <7.0 (*)    All other components within normal limits  ACETAMINOPHEN LEVEL - Abnormal; Notable for the following components:   Acetaminophen (Tylenol), Serum <10 (*)    All other components within normal limits  CBC - Abnormal; Notable for the following components:   RBC 5.91 (*)    Hemoglobin 17.8 (*)    All other components within normal limits  URINE DRUG SCREEN, QUALITATIVE ([facility] ONLY) - Abnormal; Notable for the following components:   Cannabinoid 50 Ng, Ur [place] POSITIVE (*)    All other components within normal limits  ETHANOL   ____________________________________________  EKG  None ____________________________________________  RADIOLOGY  ED MD interpretation:    Not indicated.  I, Kem Boroughs, personally viewed and evaluated these images (plain radiographs) as part of my medical decision making, as well as reviewing the written report by the radiologist.  Official radiology report(s): No results found.  ____________________________________________   PROCEDURES  Procedure(s) performed (including Critical Care):  Procedures  ____________________________________________   INITIAL IMPRESSION / ASSESSMENT AND PLAN   29 year old male presenting to the emergency department for treatment and evaluation of panic attack/anxiety.  See HPI for further details.  Plan will be to add a TSH to the labs drawn while in triage and give him a milligram of Ativan to see if that will help with his symptoms.  He will then need to  follow-up with [facility] for other counseling center of his choice.   DIFFERENTIAL DIAGNOSIS  Panic attack/anxiety/abnormal thyroid level.  ED COURSE  Patient eloped after being administered his Ativan.    ___________________________________________   FINAL CLINICAL IMPRESSION(S) / ED DIAGNOSES  Final diagnoses:  Panic attack     ED Discharge Orders    None       Larry Downs was evaluated in Emergency Department on 02/28/2020 for the symptoms described in the history of present illness. He was evaluated in the context of the global COVID-19 pandemic, which necessitated consideration that the patient might be at risk for infection with the SARS-CoV-2 virus that causes COVID-19. Institutional protocols and algorithms that pertain to the evaluation of patients at risk for COVID-19 are in a state of rapid change based on information released by regulatory bodies including the CDC and federal and state organizations. These policies and algorithms were followed during the patient's care in the ED.   Note:  This document was prepared using Dragon voice recognition software and may include unintentional dictation errors.   Chinita Pester, FNP 02/28/20 1929    Sharman Cheek, MD 02/29/20 1255

## 2020-02-28 NOTE — ED Notes (Signed)
Pt no longer sitting in bed. Bathroom is empty. Provider aware that pt eloped. Informed him when got medication that he can not drive.
